# Patient Record
Sex: Female | Born: 2000 | Race: Black or African American | Hispanic: No | Marital: Single | Smoking: Never smoker
Health system: Southern US, Community
[De-identification: ages and names within clinical notes are randomized; demographics above are authoritative.]

## PROBLEM LIST (undated history)

## (undated) DIAGNOSIS — J45909 Unspecified asthma, uncomplicated: Secondary | ICD-10-CM

## (undated) DIAGNOSIS — I10 Essential (primary) hypertension: Secondary | ICD-10-CM

## (undated) HISTORY — DX: Unspecified asthma, uncomplicated: J45.909

## (undated) HISTORY — DX: Essential (primary) hypertension: I10

## (undated) HISTORY — PX: WRIST SURGERY: SHX841

---
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
## 2021-03-27 DIAGNOSIS — M67431 Ganglion, right wrist: Secondary | ICD-10-CM | POA: Diagnosis not present

## 2021-04-24 ENCOUNTER — Encounter: Payer: Self-pay | Admitting: Internal Medicine

## 2021-04-24 ENCOUNTER — Ambulatory Visit (INDEPENDENT_AMBULATORY_CARE_PROVIDER_SITE_OTHER): Payer: BC Managed Care – PPO | Admitting: Internal Medicine

## 2021-04-24 VITALS — BP 125/69 | HR 65 | Ht 65.0 in | Wt 202.1 lb

## 2021-04-24 DIAGNOSIS — J45909 Unspecified asthma, uncomplicated: Secondary | ICD-10-CM | POA: Diagnosis not present

## 2021-04-24 DIAGNOSIS — Z0000 Encounter for general adult medical examination without abnormal findings: Secondary | ICD-10-CM

## 2021-04-24 DIAGNOSIS — Z23 Encounter for immunization: Secondary | ICD-10-CM | POA: Diagnosis not present

## 2021-04-24 DIAGNOSIS — Z789 Other specified health status: Secondary | ICD-10-CM

## 2021-04-24 NOTE — Progress Notes (Signed)
   CC: establish care and preventive care  HPI:  Ms.Ladavia A Brooker is a 20 y.o. with medical history of asthma (no prior PFTs) presenting to [facility] to establish care.  She states she moved from [place] with her mother.  She is currently enrolled at [employer] and is pursuing a degree in nursing.  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   Please see problem-based list for further details, assessments, and plans  Review of Systems:   As noted in problem-based list  Surgical History: Wisdom teeth extraction in 2017  Family History  Problem Relation Age of Onset   Diabetes Mother    Hypertension Mother    Allergies Mother    Stroke Father    Cancer Maternal Grandmother    Diabetes Maternal Grandfather      Physical Exam:  Vitals:   04/24/21 1514  BP: 125/69  Pulse: 65  SpO2: 99%  Weight: 202 lb 1.6 oz (91.7 kg)  Height: 5\' 5"  (1.651 m)    General: Well nourished, well developed HENT: NCAT Eyes: No scleral icterus, conjunctiva clear CV:no murmurs, Normal rate and rhythm Pulm: Clear to auscultation bilaterally, pulmonary effort GI: Nondistended, bowel sounds present, soft MSK: Normal bulk and tone, no edema in lower extremities bilaterally Skin: warm and dry Psych: normal mood and affect  Assessment & Plan:   See Encounters Tab for problem based charting.  Patient seen with Dr. 

## 2021-04-24 NOTE — Assessment & Plan Note (Addendum)
Received tetanus vaccine today (Td).  Patient is interested in HPV vaccination.  Asked her to call to schedule an appointment at [facility] Department to receive this.  She states that she has not received a flu vaccine in the past because her mom did not have her get it.  We discussed the benefits of flu vaccination and Kayla Perez stated she would think it over.  History of Nexplanon placement in 01/2019, Will need referral to OBGYN to have this replaced in 01/2022.  Asked patient to follow-up in 6 months.  Discussed recommendation of screening for HIV, Hep C, and Gonarrhea/ Chlamydia.  At this time, she is not interested in screenings.

## 2021-04-24 NOTE — Assessment & Plan Note (Signed)
Patient presents with history of asthma diagnosed in childhood.  She states that she has never had formal testing for this.  She does have a rescue albuterol inhaler if she were to need it. The only time she uses the inhaler and is if she has bronchitis, which has not happened in the long time.

## 2021-04-24 NOTE — Patient Instructions (Signed)
[name], it was a pleasure seeing you today!  Today we discussed: Preventative medicine- today you received a tetanus vaccination.  You will need to get this every 10 years to help prevent tetanus.  We also talked about getting a vaccine called Gardisil to prevent cancer from Human papilloma virus.  Please call the [facility] department to see if they are able to give you that vaccine at [phone].  We also discussed other screening recommended for your age including gonarrhea/ chalmydia, Hep C, and HIV.  You did not want those at this time.  I have ordered the following labs today:  Lab Orders  No laboratory test(s) ordered today     Tests ordered today:  None  Referrals ordered today:   Referral Orders  No referral(s) requested today     I have ordered the following medication/changed the following medications:   Stop the following medications: Medications Discontinued During This Encounter  Medication Reason   fluticasone (FLONASE) 50 MCG/ACT nasal spray Change in therapy   loratadine (CLARITIN) 10 MG tablet Change in therapy   fluticasone (FLONASE) 50 MCG/ACT nasal spray Change in therapy   ondansetron (ZOFRAN ODT) 4 MG disintegrating tablet Change in therapy   desloratadine (CLARINEX) 5 MG tablet Change in therapy     Start the following medications: No orders of the defined types were placed in this encounter.    Follow-up: 6 months   Please make sure to arrive 15 minutes prior to your next appointment. If you arrive late, you may be asked to reschedule.   We look forward to seeing you next time. Please call our clinic at [phone] if you have any questions or concerns. The best time to call is Monday-Friday from 9am-4pm, but there is someone available 24/7. If after hours or the weekend, call the main hospital number and ask for the Internal Medicine Resident On-Call. If you need medication refills, please notify your pharmacy one week in  advance and they will send [place] a request.  Thank you for letting us take part in your care. Wishing you the best!  Thank you, Dr. [facility]

## 2021-04-25 NOTE — Progress Notes (Signed)
Internal Medicine Clinic Attending  I saw and evaluated the patient.  I personally confirmed the key portions of the history and exam documented by Dr. Masters and I reviewed pertinent patient test results.  The assessment, diagnosis, and plan were formulated together and I agree with the documentation in the resident's note.  

## 2021-08-10 DIAGNOSIS — M7671 Peroneal tendinitis, right leg: Secondary | ICD-10-CM | POA: Diagnosis not present

## 2021-11-02 DIAGNOSIS — M67431 Ganglion, right wrist: Secondary | ICD-10-CM | POA: Diagnosis not present

## 2021-11-08 ENCOUNTER — Ambulatory Visit: Payer: Self-pay

## 2021-11-08 NOTE — Telephone Encounter (Signed)
Pt mother stated pt had a procedure on the wrist on Friday, Nov 02, 2021. After the procedure, pt stated a possible infection in her nose; the nose is bleeding, crusty, itchy, and tender. Pt mother mentioned pt went to urgent care and got medication, but it's not getting any better.     Chief Complaint: Sore inside left nostril, seen in UC and started on Amoxicillin and steroid nasal spray. Symptoms: Has "crusty discharge." Frequency: 3 days ago Pertinent Negatives: Patient denies fever Disposition: [] ED /[] Urgent Care (no appt availability in office) / [] Appointment(In office/virtual)/ []  [facility]/ [x] Home Care/ [] Refused Recommended Disposition /[] [place] [facility]/ []  Follow-up with PCP Additional Notes: Instructed to complete medications and watch for signs of infection and go back to UC. No PCP.  Answer Assessment - Initial Assessment Questions 1. APPEARANCE of SORES: "What do the sores look like?"     White thick pus 2. NUMBER: "How many sores are there?"     1 3. SIZE: "How big is the largest sore?"     Unsure 4. LOCATION: "Where are the sores located?"     Right nostril 5. ONSET: "When did the sores begin?"     Last week 6. CAUSE: "What do you think is causing the sores?"     Infection 7. OTHER SYMPTOMS: "Do you have any other symptoms?" (e.g., fever, new weakness)     Painful, crusty  Protocols used: Sores-A-AH

## 2021-11-14 ENCOUNTER — Ambulatory Visit (HOSPITAL_COMMUNITY)
Admission: EM | Admit: 2021-11-14 | Discharge: 2021-11-14 | Disposition: A | Discharge status: Home / Self Care | Payer: BC Managed Care – PPO | Attending: Physician Assistant | Admitting: Physician Assistant

## 2021-11-14 ENCOUNTER — Encounter (HOSPITAL_COMMUNITY): Payer: Self-pay

## 2021-11-14 DIAGNOSIS — J309 Allergic rhinitis, unspecified: Secondary | ICD-10-CM | POA: Diagnosis not present

## 2021-11-14 DIAGNOSIS — R0981 Nasal congestion: Secondary | ICD-10-CM

## 2021-11-14 DIAGNOSIS — J029 Acute pharyngitis, unspecified: Secondary | ICD-10-CM

## 2021-11-14 LAB — POCT RAPID STREP A, ED / UC: Streptococcus, Group A Screen (Direct): NEGATIVE

## 2021-11-14 LAB — POCT INFECTIOUS MONO SCREEN, ED / UC: Mono Screen: NEGATIVE

## 2021-11-14 MED ORDER — LEVOCETIRIZINE DIHYDROCHLORIDE 5 MG PO TABS: 5.0000 mg | ORAL_TABLET | Freq: Every evening | ORAL | 0 refills | Status: AC

## 2021-11-14 MED ORDER — PREDNISONE 20 MG PO TABS: 20.0000 mg | ORAL_TABLET | Freq: Every day | ORAL | 0 refills | Status: AC

## 2021-11-14 MED ORDER — FLUTICASONE PROPIONATE 50 MCG/ACT NA SUSP: 1.0000 | Freq: Every day | NASAL | 0 refills | Status: DC

## 2021-11-14 NOTE — ED Triage Notes (Signed)
Pt c/o nasal congestion for several days. She thinks she has nasal polyp.Wrist surgery x 1 week ago.

## 2021-11-14 NOTE — ED Provider Notes (Signed)
[facility]    CSN: [id number] Arrival date & time: 11/14/21  1612      History   Chief Complaint Chief Complaint  Patient presents with   Nasal Polyps    HPI Kayla Perez is a 21 y.o. female.   Patient presents today with a weeklong history of nasal congestion and right-sided sinus pressure.  She had a cyst removed in her right wrist on 11/02/2021 and developed nasal congestion several days later.  She was seen by different urgent care on 11/04/2021 at which point they thought symptoms related to nasal polyps and she was started on Augmentin.  She reports taking medication as prescribed with last dose today.  Despite medication she continues to have right-sided sinus pressure as well as drainage in her throat.  She denies formal diagnosis of allergies but does have a history of asthma.  She denies any recent steroid use or history of diabetes.  She is confident that she is not pregnant.  She denies any known sick contacts.  She has not tried any over-the-counter medication for symptom management.   Past Medical History:  Diagnosis Date   Asthma     Patient Active Problem List   Diagnosis Date Noted   Asthma 04/24/2021   Health care maintenance 04/24/2021    Past Surgical History:  Procedure Laterality Date   WISDOM TOOTH EXTRACTION Bilateral 2017    OB History   No obstetric history on file.      Home Medications    Prior to Admission medications   Medication Sig Start Date End Date Taking? Authorizing Provider  fluticasone (FLONASE) 50 MCG/ACT nasal spray Place 1 spray into both nostrils daily. 11/14/21  Yes Sherian Valenza, Noberto Retort, PA-C  levocetirizine (XYZAL ALLERGY 24HR) 5 MG tablet Take 1 tablet (5 mg total) by mouth every evening. 11/14/21  Yes Estill Llerena K, PA-C  predniSONE (DELTASONE) 20 MG tablet Take 1 tablet (20 mg total) by mouth daily for 3 days. 11/14/21 11/17/21 Yes Krimson Massmann K, PA-C  albuterol (PROVENTIL HFA;VENTOLIN HFA) 108 (90 Base) MCG/ACT  inhaler Inhale into the lungs.    [provider]    Family History Family History  Problem Relation Age of Onset   Diabetes Mother    Hypertension Mother    Allergies Mother    Stroke Father    Cancer Maternal Grandmother    Diabetes Maternal Grandfather     Social History Social History   Tobacco Use   Smoking status: Never   Smokeless tobacco: Never  Vaping Use   Vaping Use: Never used  Substance Use Topics   Alcohol use: No   Drug use: No     Allergies   Patient has no known allergies.   Review of Systems Review of Systems  Constitutional:  Positive for activity change. Negative for appetite change, fatigue and fever.  HENT:  Positive for congestion and sore throat. Negative for sinus pressure and sneezing.   Respiratory:  Negative for cough and shortness of breath.   Cardiovascular:  Negative for chest pain.  Gastrointestinal:  Negative for abdominal pain, diarrhea, nausea and vomiting.  Neurological:  Negative for dizziness, light-headedness and headaches.    Physical Exam Triage Vital Signs ED Triage Vitals [11/14/21 1750]  Enc Vitals Group     BP (!) 146/91     Pulse Rate 75     Resp 16     Temp 98.1 F (36.7 C)     Temp Source Oral  SpO2 100 %     Weight      Height      Head Circumference      Peak Flow      Pain Score      Pain Loc      Pain Edu?      Excl. in GC?    No data found.  Updated Vital Signs BP (!) 146/91 (BP Location: Right Arm)   Pulse 75   Temp 98.1 F (36.7 C) (Oral)   Resp 16   SpO2 100%   Visual Acuity Right Eye Distance:   Left Eye Distance:   Bilateral Distance:    Right Eye Near:   Left Eye Near:    Bilateral Near:     Physical Exam Vitals reviewed.  Constitutional:      General: She is awake. She is not in acute distress.    Appearance: Normal appearance. She is well-developed. She is not ill-appearing.     Comments: Appears stated age in no acute distress sitting comfortably in exam room   HENT:     Head: Normocephalic and atraumatic.     Right Ear: Tympanic membrane, ear canal and external ear normal. Tympanic membrane is not erythematous or bulging.     Left Ear: Tympanic membrane, ear canal and external ear normal. Tympanic membrane is not erythematous or bulging.     Nose: Congestion present.     Right Nostril: No epistaxis or septal hematoma.     Left Nostril: No septal hematoma.     Right Turbinates: Enlarged.     Left Turbinates: Enlarged.     Right Sinus: Maxillary sinus tenderness present. No frontal sinus tenderness.     Left Sinus: No maxillary sinus tenderness or frontal sinus tenderness.     Mouth/Throat:     Pharynx: Uvula midline. Posterior oropharyngeal erythema present. No oropharyngeal exudate.     Tonsils: No tonsillar exudate. 1+ on the right. 1+ on the left.  Cardiovascular:     Rate and Rhythm: Normal rate and regular rhythm.     Heart sounds: Normal heart sounds, S1 normal and S2 normal. No murmur heard. Pulmonary:     Effort: Pulmonary effort is normal.     Breath sounds: Normal breath sounds. No wheezing, rhonchi or rales.     Comments: Clear to auscultation bilaterally Psychiatric:        Behavior: Behavior is cooperative.     UC Treatments / Results  Labs (all labs ordered are listed, but only abnormal results are displayed) Labs Reviewed  POCT RAPID STREP A, ED / UC  POCT INFECTIOUS MONO SCREEN, ED / UC    EKG   Radiology No results found.  Procedures Procedures (including critical care time)  Medications Ordered in UC Medications - No data to display  Initial Impression / Assessment and Plan / UC Course  I have reviewed the triage vital signs and the nursing notes.  Pertinent labs & imaging results that were available during my care of the patient were reviewed by me and considered in my medical decision making (see chart for details).     Discussed that symptoms seem most likely to be related to allergies given  clinical presentation.  She has already completed course of appropriate antibiotics.  She is very concerned about strep throat or some other throat infection given symptoms.  Strep testing was obtained per her request and was negative.  Mono testing was also negative.  Recommended she start Xyzal daily.  Also recommended she use nasal saline as well as Flonase for symptom relief.  We will start short course of prednisone of 20 mg for 3 days to help with symptoms and she was instructed to avoid NSAIDs with this medication due to risk of GI bleeding.  If symptoms persist she is to follow-up with ENT for further evaluation and management was given contact information for local provider with instruction to call to schedule an appointment.  Discussed that if she has any worsening symptoms she is to return for reevaluation.  Strict return precautions given to which she expressed understanding.  Work excuse note provided.  Final Clinical Impressions(s) / UC Diagnoses   Final diagnoses:  Allergic rhinitis, unspecified seasonality, unspecified trigger  Nasal congestion     Discharge Instructions      Your strep and mono were negative.  I believe that your symptoms are related to inflammation and swelling of your sinuses from allergies.  Start prednisone 20 mg for 3 days.  Do not take NSAIDs including aspirin, ibuprofen/Advil, naproxen/Aleve with this medication as it can cause stomach bleeding.  Take Xyzal daily.  Start Flonase daily.  Use nasal saline for additional symptom relief.  If your symptoms are not improving please follow-up with ENT as we discussed.  Call to schedule an appointment.  If anything worsens return for reevaluation.     ED Prescriptions     Medication Sig Dispense Auth. Provider   levocetirizine (XYZAL ALLERGY 24HR) 5 MG tablet Take 1 tablet (5 mg total) by mouth every evening. 30 tablet Naidelin Gugliotta K, PA-C   fluticasone (FLONASE) 50 MCG/ACT nasal spray Place 1 spray into both  nostrils daily. 16 g Fredrika Canby K, PA-C   predniSONE (DELTASONE) 20 MG tablet Take 1 tablet (20 mg total) by mouth daily for 3 days. 3 tablet Shaunita Seney, Noberto Retort, PA-C      PDMP not reviewed this encounter.   Jeani Hawking, PA-C 11/14/21 1856

## 2021-11-14 NOTE — Discharge Instructions (Signed)
Your strep and mono were negative.  I believe that your symptoms are related to inflammation and swelling of your sinuses from allergies.  Start prednisone 20 mg for 3 days.  Do not take NSAIDs including aspirin, ibuprofen/Advil, naproxen/Aleve with this medication as it can cause stomach bleeding.  Take Xyzal daily.  Start Flonase daily.  Use nasal saline for additional symptom relief.  If your symptoms are not improving please follow-up with ENT as we discussed.  Call to schedule an appointment.  If anything worsens return for reevaluation.

## 2021-12-03 DIAGNOSIS — Z136 Encounter for screening for cardiovascular disorders: Secondary | ICD-10-CM | POA: Diagnosis not present

## 2021-12-03 DIAGNOSIS — E669 Obesity, unspecified: Secondary | ICD-10-CM | POA: Diagnosis not present

## 2021-12-03 DIAGNOSIS — Z793 Long term (current) use of hormonal contraceptives: Secondary | ICD-10-CM | POA: Diagnosis not present

## 2021-12-03 DIAGNOSIS — L739 Follicular disorder, unspecified: Secondary | ICD-10-CM | POA: Diagnosis not present

## 2021-12-11 DIAGNOSIS — E669 Obesity, unspecified: Secondary | ICD-10-CM | POA: Diagnosis not present

## 2021-12-11 DIAGNOSIS — Z6836 Body mass index (BMI) 36.0-36.9, adult: Secondary | ICD-10-CM | POA: Diagnosis not present

## 2021-12-11 DIAGNOSIS — E7801 Familial hypercholesterolemia: Secondary | ICD-10-CM | POA: Diagnosis not present

## 2021-12-11 DIAGNOSIS — J302 Other seasonal allergic rhinitis: Secondary | ICD-10-CM | POA: Diagnosis not present

## 2022-01-20 IMAGING — US US ABDOMEN LIMITED
1 series · 15 of 25 positions shown · non-contrast
Comparison: None.

CLINICAL DATA: Right upper quadrant abdominal pain

EXAM:
ULTRASOUND ABDOMEN LIMITED RIGHT UPPER QUADRANT

[Series 1: us abdomen limited ruq mc & wl · 15 of 40 slices shown]
[im 1/40]
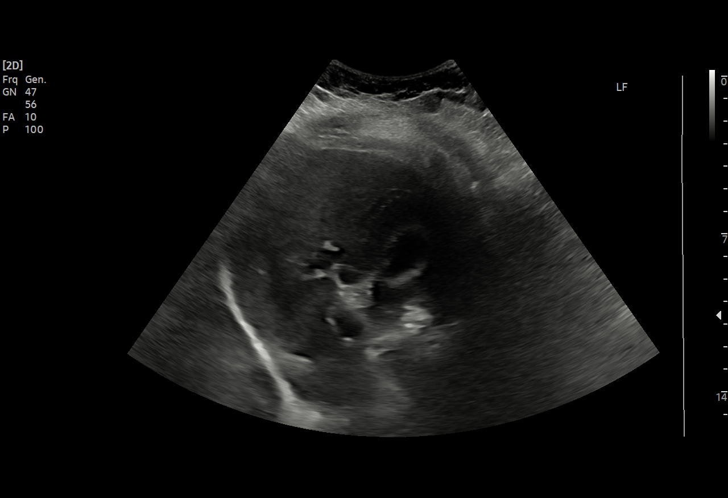
[im 4/40]
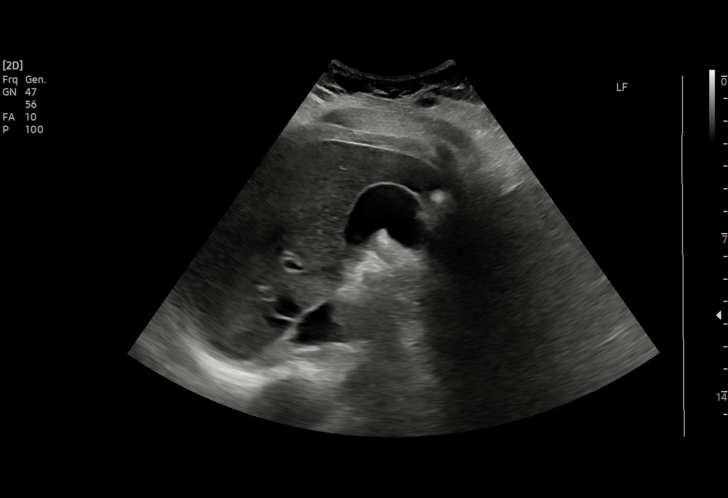
[im 7/40]
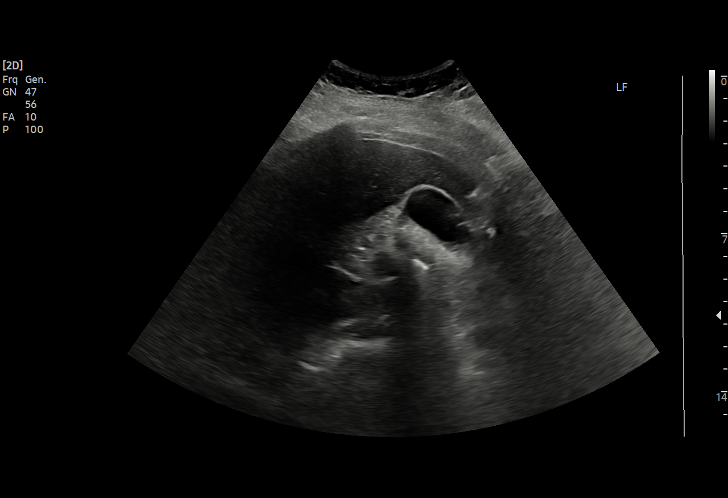
[im 9/40]
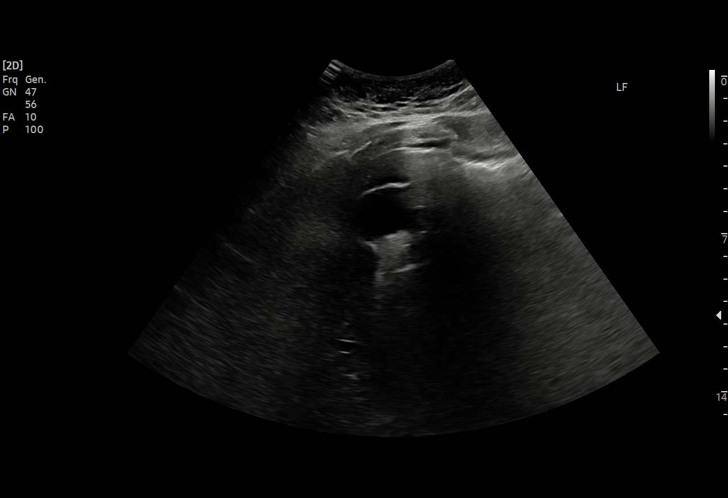
[im 12/40]
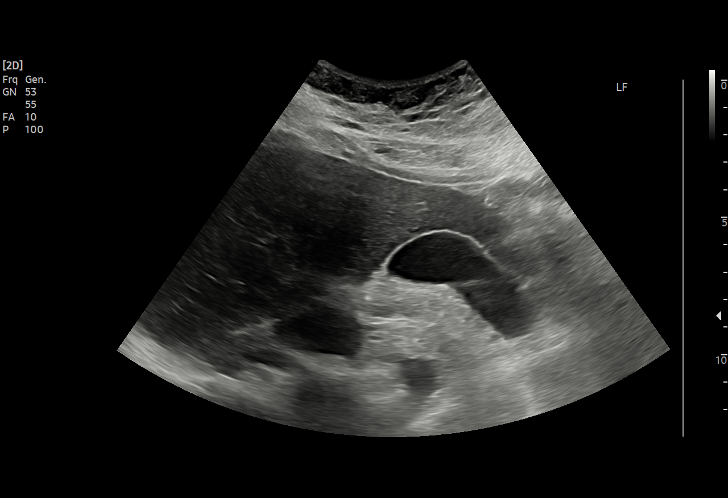
[im 15/40]
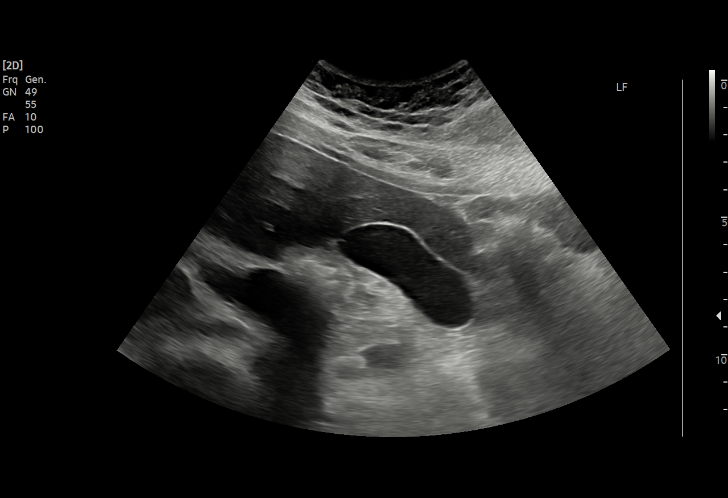
[im 17/40]
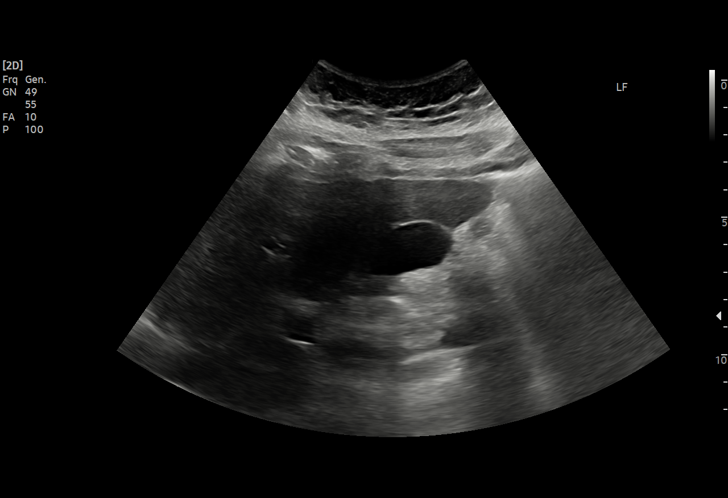
[im 20/40]
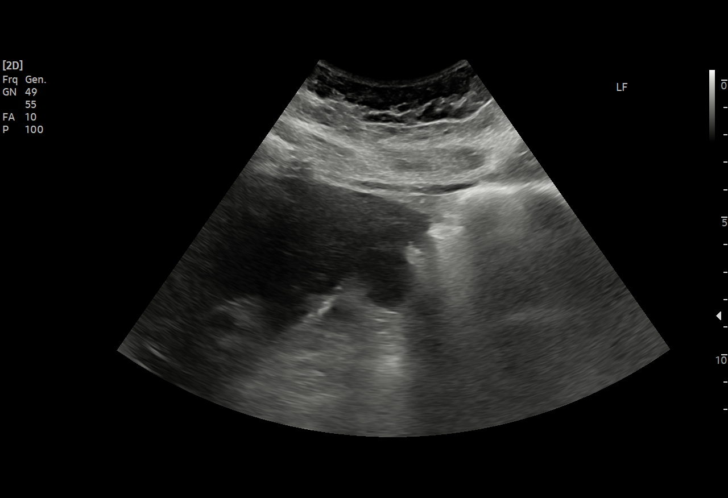
[im 23/40]
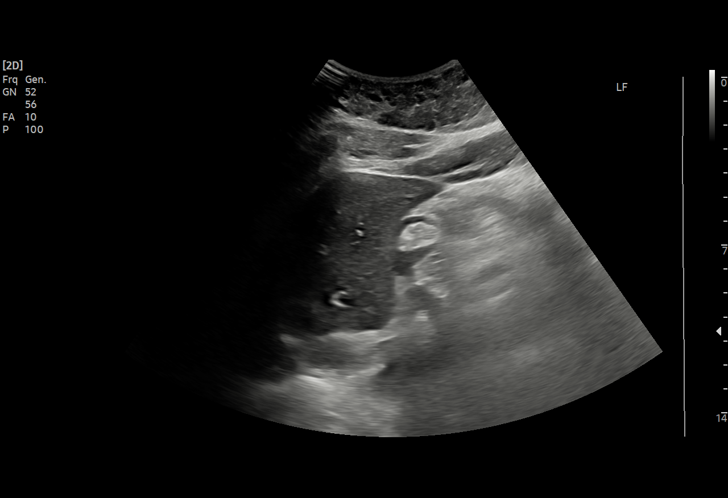
[im 25/40]
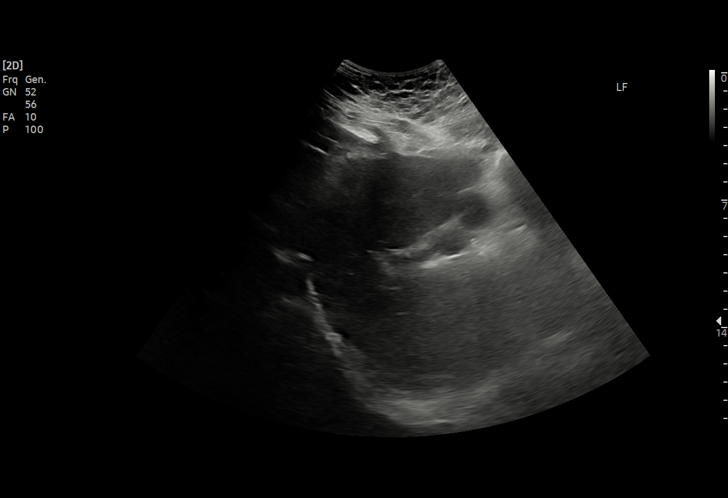
[im 28/40]
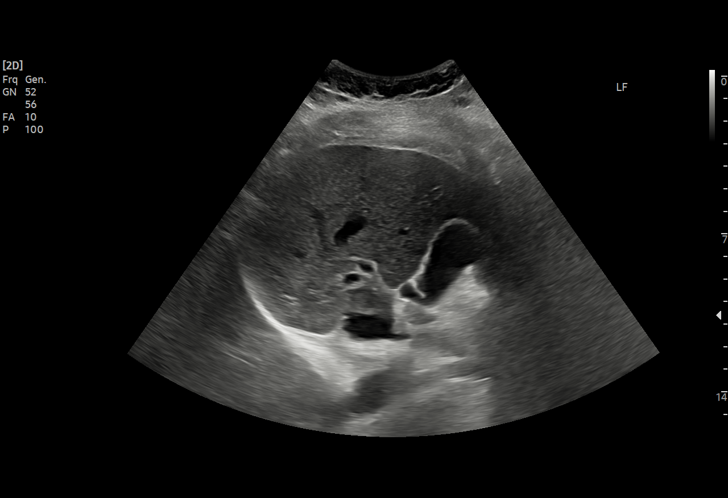
[im 31/40]
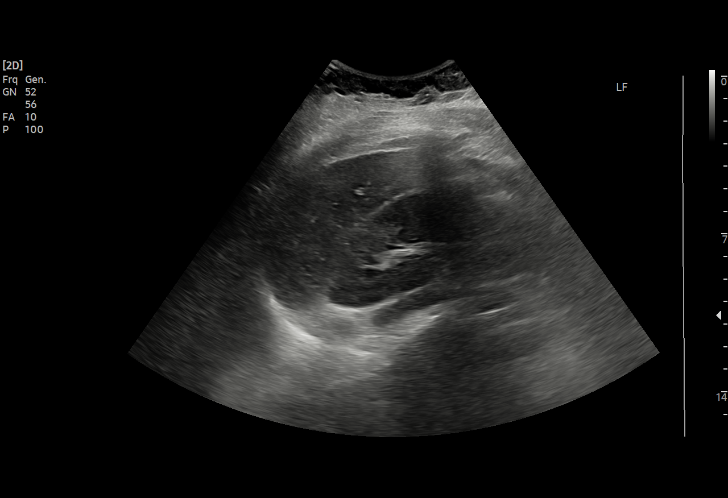
[im 33/40]
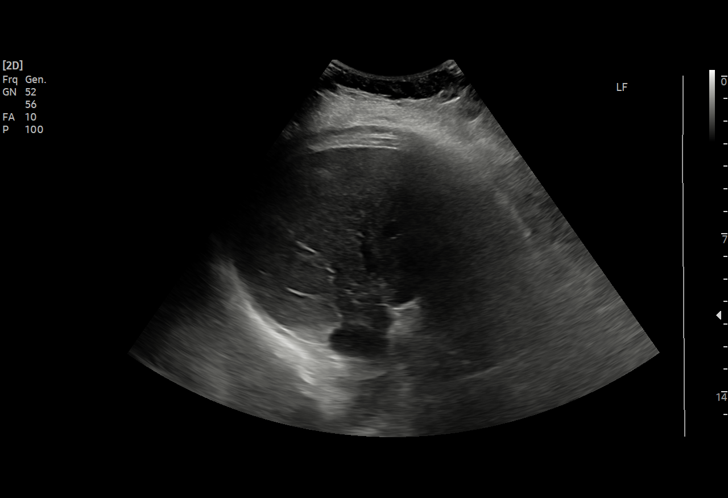
[im 36/40]
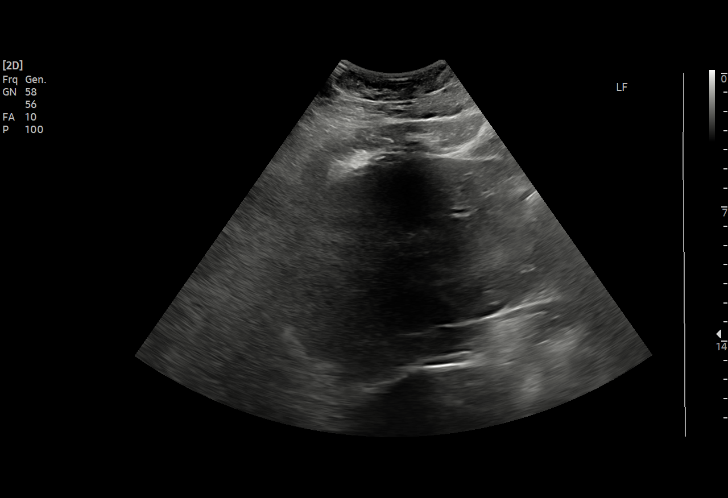
[im 40/40]
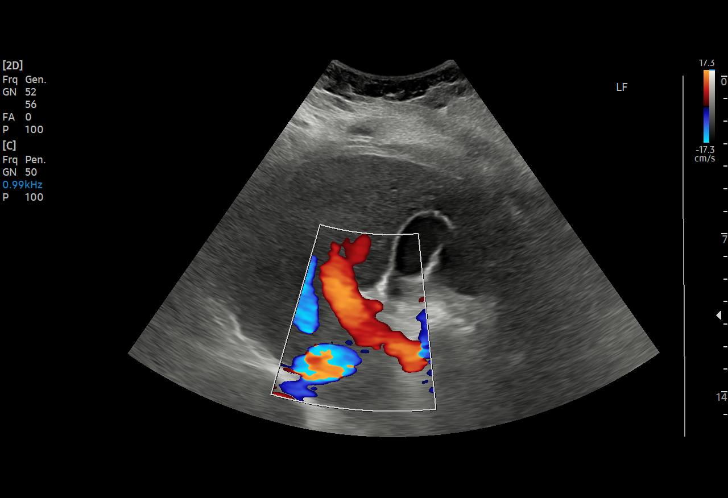

[15 of 25 positions shown; findings below may reference images not displayed]

FINDINGS: Gallbladder:

No gallstones or wall thickening visualized. No sonographic Murphy
sign noted by sonographer.

Common bile duct:

Diameter: 6 mm.  Normal.

Liver:

No focal lesion identified. Within normal limits in parenchymal
echogenicity. Portal vein is patent on color Doppler imaging with
normal direction of blood flow towards the liver.

Other: None.
IMPRESSION: Normal right upper quadrant ultrasound.

## 2022-02-11 ENCOUNTER — Other Ambulatory Visit (HOSPITAL_COMMUNITY): Payer: Self-pay

## 2022-02-11 DIAGNOSIS — Z3202 Encounter for pregnancy test, result negative: Secondary | ICD-10-CM | POA: Diagnosis not present

## 2022-02-11 DIAGNOSIS — Z01419 Encounter for gynecological examination (general) (routine) without abnormal findings: Secondary | ICD-10-CM | POA: Diagnosis not present

## 2022-02-11 DIAGNOSIS — Z793 Long term (current) use of hormonal contraceptives: Secondary | ICD-10-CM | POA: Diagnosis not present

## 2022-02-11 DIAGNOSIS — E669 Obesity, unspecified: Secondary | ICD-10-CM | POA: Diagnosis not present

## 2022-02-11 MED ORDER — SEMAGLUTIDE-WEIGHT MANAGEMENT 0.25 MG/0.5ML ~~LOC~~ SOAJ
SUBCUTANEOUS | 0 refills | Status: DC
  Filled 2022-02-11 – 2022-02-14 (×2): qty 2, 28d supply, fill #0

## 2022-02-14 ENCOUNTER — Other Ambulatory Visit (HOSPITAL_COMMUNITY): Payer: Self-pay

## 2022-03-05 ENCOUNTER — Ambulatory Visit: Payer: BC Managed Care – PPO | Admitting: Family Medicine

## 2022-03-07 ENCOUNTER — Other Ambulatory Visit (HOSPITAL_COMMUNITY): Payer: Self-pay

## 2022-05-21 ENCOUNTER — Encounter (HOSPITAL_BASED_OUTPATIENT_CLINIC_OR_DEPARTMENT_OTHER): Payer: Self-pay | Admitting: Advanced Practice Midwife

## 2022-05-21 ENCOUNTER — Ambulatory Visit (INDEPENDENT_AMBULATORY_CARE_PROVIDER_SITE_OTHER): Payer: BC Managed Care – PPO | Admitting: Advanced Practice Midwife

## 2022-05-21 VITALS — BP 140/88 | HR 86 | Ht 65.0 in | Wt 197.4 lb

## 2022-05-21 DIAGNOSIS — Z3046 Encounter for surveillance of implantable subdermal contraceptive: Secondary | ICD-10-CM

## 2022-05-21 DIAGNOSIS — Z3041 Encounter for surveillance of contraceptive pills: Secondary | ICD-10-CM | POA: Insufficient documentation

## 2022-05-21 NOTE — Progress Notes (Signed)
GYNECOLOGY CLINIC PROCEDURE NOTE  Kayla Perez is a 21 y.o. G0P0000 here for Nexplanon removal. Nexplanon was placed in 2020 and was due for removal this year. She desires to switch to OCPs and have monthly menses at this time.  PCP prescribed low dose OCPs which she has taken x 2 months without difficulty.     Nexplanon Removal Patient identified, informed consent performed, consent signed.   Appropriate time out taken. Nexplanon site identified.  Area prepped in usual sterile fashon. One ml of 1% lidocaine was used to anesthetize the area at the distal end of the implant. A small stab incision was made right beside the implant on the distal portion.  The Nexplanon rod was grasped using hemostats and removed without difficulty.  There was minimal blood loss. There were no complications.  3 ml of 1% lidocaine was injected around the incision for post-procedure analgesia.  Steri-strips were applied over the small incision.  A pressure bandage was applied to reduce any bruising.  The patient tolerated the procedure well and was given post procedure instructions.  Patient is planning to use OCPs for contraception/attempt conception.  F/U in 1 year for Pap or sooner as needed. BP mildly elevated today, discussed with patient who will take BP on home cuff and f/u with PCP if persistently elevated.  Reviewed warning signs with OCPs/reasons to seek care.  No follow-ups on file.   Sharen Counter, CNM 8:58 AM

## 2022-09-06 DIAGNOSIS — J302 Other seasonal allergic rhinitis: Secondary | ICD-10-CM | POA: Diagnosis not present

## 2022-09-06 DIAGNOSIS — R059 Cough, unspecified: Secondary | ICD-10-CM | POA: Diagnosis not present

## 2022-09-06 DIAGNOSIS — J019 Acute sinusitis, unspecified: Secondary | ICD-10-CM | POA: Diagnosis not present

## 2022-10-21 DIAGNOSIS — N921 Excessive and frequent menstruation with irregular cycle: Secondary | ICD-10-CM | POA: Diagnosis not present

## 2022-10-21 DIAGNOSIS — N939 Abnormal uterine and vaginal bleeding, unspecified: Secondary | ICD-10-CM | POA: Diagnosis not present

## 2023-01-31 ENCOUNTER — Emergency Department (HOSPITAL_COMMUNITY)
Admission: EM | Admit: 2023-01-31 | Discharge: 2023-01-31 | Disposition: A | Discharge status: Home / Self Care | Payer: BC Managed Care – PPO | Attending: Emergency Medicine | Admitting: Emergency Medicine

## 2023-01-31 DIAGNOSIS — R197 Diarrhea, unspecified: Secondary | ICD-10-CM | POA: Diagnosis not present

## 2023-01-31 DIAGNOSIS — R000 Tachycardia, unspecified: Secondary | ICD-10-CM | POA: Diagnosis not present

## 2023-01-31 DIAGNOSIS — R1110 Vomiting, unspecified: Secondary | ICD-10-CM | POA: Diagnosis present

## 2023-01-31 DIAGNOSIS — R112 Nausea with vomiting, unspecified: Secondary | ICD-10-CM | POA: Diagnosis not present

## 2023-01-31 DIAGNOSIS — T383X1A Poisoning by insulin and oral hypoglycemic [antidiabetic] drugs, accidental (unintentional), initial encounter: Secondary | ICD-10-CM | POA: Diagnosis not present

## 2023-01-31 DIAGNOSIS — T50905A Adverse effect of unspecified drugs, medicaments and biological substances, initial encounter: Secondary | ICD-10-CM

## 2023-01-31 LAB — URINALYSIS, ROUTINE W REFLEX MICROSCOPIC
Bacteria, UA: NONE SEEN
Bilirubin Urine: NEGATIVE
Glucose, UA: NEGATIVE mg/dL
Ketones, ur: 80 mg/dL — AB
Leukocytes,Ua: NEGATIVE
Nitrite: NEGATIVE
Protein, ur: 100 mg/dL — AB
Specific Gravity, Urine: 1.031 — ABNORMAL HIGH (ref 1.005–1.030)
pH: 8 (ref 5.0–8.0)

## 2023-01-31 LAB — CBC
HCT: 44.9 % (ref 36.0–46.0)
Hemoglobin: 14.6 g/dL (ref 12.0–15.0)
MCH: 27.1 pg (ref 26.0–34.0)
MCHC: 32.5 g/dL (ref 30.0–36.0)
MCV: 83.5 fL (ref 80.0–100.0)
Platelets: 273 10*3/uL (ref 150–400)
RBC: 5.38 MIL/uL — ABNORMAL HIGH (ref 3.87–5.11)
RDW: 14.1 % (ref 11.5–15.5)
WBC: 16.1 10*3/uL — ABNORMAL HIGH (ref 4.0–10.5)
nRBC: 0 % (ref 0.0–0.2)

## 2023-01-31 LAB — COMPREHENSIVE METABOLIC PANEL
ALT: 22 U/L (ref 0–44)
AST: 21 U/L (ref 15–41)
Albumin: 4.4 g/dL (ref 3.5–5.0)
Alkaline Phosphatase: 85 U/L (ref 38–126)
Anion gap: 10 (ref 5–15)
BUN: 12 mg/dL (ref 6–20)
CO2: 23 mmol/L (ref 22–32)
Calcium: 9.7 mg/dL (ref 8.9–10.3)
Chloride: 104 mmol/L (ref 98–111)
Creatinine, Ser: 0.48 mg/dL (ref 0.44–1.00)
GFR, Estimated: >60 mL/min (ref >60)
Glucose, Bld: 105 mg/dL — ABNORMAL HIGH (ref 70–99)
Potassium: 3.8 mmol/L (ref 3.5–5.1)
Sodium: 137 mmol/L (ref 135–145)
Total Bilirubin: 0.9 mg/dL (ref 0.3–1.2)
Total Protein: 8.4 g/dL — ABNORMAL HIGH (ref 6.5–8.1)

## 2023-01-31 LAB — LIPASE, BLOOD: Lipase: 27 U/L (ref 11–51)

## 2023-01-31 LAB — CBG MONITORING, ED: Glucose-Capillary: 101 mg/dL — ABNORMAL HIGH (ref 70–99)

## 2023-01-31 LAB — HCG, SERUM, QUALITATIVE: Preg, Serum: NEGATIVE

## 2023-01-31 MED ORDER — METOCLOPRAMIDE HCL 5 MG/ML IJ SOLN
10.0000 mg | Freq: Once | INTRAMUSCULAR | Status: AC
  Administered 2023-01-31: 10 mg via INTRAVENOUS
  Filled 2023-01-31: qty 2

## 2023-01-31 MED ORDER — PROCHLORPERAZINE EDISYLATE 10 MG/2ML IJ SOLN
10.0000 mg | Freq: Once | INTRAMUSCULAR | Status: AC
  Administered 2023-01-31: 10 mg via INTRAVENOUS
  Filled 2023-01-31: qty 2

## 2023-01-31 MED ORDER — ONDANSETRON 8 MG PO TBDP: 8.0000 mg | ORAL_TABLET | Freq: Three times a day (TID) | ORAL | 0 refills | Status: DC | PRN

## 2023-01-31 MED ORDER — PROMETHAZINE HCL 25 MG PO TABS: 25.0000 mg | ORAL_TABLET | Freq: Four times a day (QID) | ORAL | 0 refills | Status: DC | PRN

## 2023-01-31 MED ORDER — ONDANSETRON 8 MG PO TBDP
8.0000 mg | ORAL_TABLET | Freq: Once | ORAL | Status: AC
  Administered 2023-01-31: 8 mg via ORAL
  Filled 2023-01-31: qty 1

## 2023-01-31 MED ORDER — ONDANSETRON 4 MG PO TBDP
4.0000 mg | ORAL_TABLET | Freq: Once | ORAL | Status: AC | PRN
  Administered 2023-01-31: 4 mg via ORAL
  Filled 2023-01-31: qty 1

## 2023-01-31 MED ORDER — LACTATED RINGERS IV BOLUS
1000.0000 mL | Freq: Once | INTRAVENOUS | Status: AC
  Administered 2023-01-31: 1000 mL via INTRAVENOUS

## 2023-01-31 MED ORDER — PROMETHAZINE HCL 25 MG RE SUPP: 25.0000 mg | Freq: Four times a day (QID) | RECTAL | 0 refills | Status: DC | PRN

## 2023-01-31 MED ORDER — ACETAMINOPHEN 325 MG PO TABS
650.0000 mg | ORAL_TABLET | Freq: Once | ORAL | Status: AC
  Administered 2023-01-31: 650 mg via ORAL
  Filled 2023-01-31: qty 2

## 2023-01-31 MED ORDER — LACTATED RINGERS IV SOLN: INTRAVENOUS | Status: DC

## 2023-01-31 NOTE — ED Notes (Signed)
Pt reporting nausea returning as well as burning stomach pain.

## 2023-01-31 NOTE — Discharge Instructions (Addendum)
Your prescription for nausea medicine has been sent to [facility] pharmacy.  As recommended, please proceed with clear liquid diet for the next 2 days.  Slowly advance to more solid diet.  Avoid processed foods, fried food and dairy products until completely better.

## 2023-01-31 NOTE — ED Provider Notes (Signed)
[place] EMERGENCY DEPARTMENT AT [facility] Provider Note   CSN: [id number] Arrival date & time: 01/31/23  0047     History  Chief Complaint  Patient presents with   Emesis    Kayla Perez is a 22 y.o. female.  The history is provided by the patient and medical records.  Emesis  Kayla Perez is a 22 y.o. female who presents to the Emergency Department complaining of vomiting.  She presents to the emergency department for severe vomiting that started at 2 PM.  At 10 AM this morning she took a 1 mg dose of her mother's Ozempic.  She does report episodes of getting hot as well as a small amount of diarrhea but no reported fevers.  She has abdominal discomfort but not discrete pain.  She did take a similar medication 1 year ago but she did not tolerate it due to nausea.  LMP was July 19.  No dysuria.  No known medical problems.     Home Medications Prior to Admission medications   Medication Sig Start Date End Date Taking? Authorizing Provider  albuterol (PROVENTIL HFA;VENTOLIN HFA) 108 (90 Base) MCG/ACT inhaler Inhale into the lungs. Patient not taking: Reported on 05/21/2022    [provider]  BLISOVI FE 1/20 1-20 MG-MCG tablet Take 1 tablet by mouth daily. 04/05/22   [provider]  fluticasone (FLONASE) 50 MCG/ACT nasal spray Place 1 spray into both nostrils daily. Patient not taking: Reported on 05/21/2022 11/14/21   Raspet, Noberto Retort, PA-C  levocetirizine (XYZAL ALLERGY 24HR) 5 MG tablet Take 1 tablet (5 mg total) by mouth every evening. 11/14/21   Raspet, Noberto Retort, PA-C  Semaglutide-Weight Management 0.25 MG/0.5ML SOAJ Inject 0.25 mg into the skin once weekly. Patient not taking: Reported on 05/21/2022 12/26/21         Allergies    Patient has no known allergies.    Review of Systems   Review of Systems  Gastrointestinal:  Positive for vomiting.  All other systems reviewed and are negative.   Physical Exam Updated Vital Signs BP 122/72    Pulse 76   Temp 99.1 F (37.3 C)   Resp 20   Ht 5\' 5"  (1.651 m)   Wt 86.2 kg   SpO2 100%   BMI 31.62 kg/m  Physical Exam Vitals and nursing note reviewed.  Constitutional:      Appearance: She is well-developed.  HENT:     Head: Normocephalic and atraumatic.  Cardiovascular:     Rate and Rhythm: Regular rhythm. Tachycardia present.  Pulmonary:     Effort: Pulmonary effort is normal. No respiratory distress.     Breath sounds: Normal breath sounds.  Abdominal:     Palpations: Abdomen is soft.     Tenderness: There is no abdominal tenderness. There is no guarding or rebound.  Musculoskeletal:        General: No tenderness.  Skin:    General: Skin is warm and dry.  Neurological:     Mental Status: She is alert and oriented to person, place, and time.  Psychiatric:        Behavior: Behavior normal.     ED Results / Procedures / Treatments   Labs (all labs ordered are listed, but only abnormal results are displayed) Labs Reviewed  COMPREHENSIVE METABOLIC PANEL - Abnormal; Notable for the following components:      Result Value   Glucose, Bld 105 (*)    Total Protein 8.4 (*)  All other components within normal limits  CBC - Abnormal; Notable for the following components:   WBC 16.1 (*)    RBC 5.38 (*)    All other components within normal limits  URINALYSIS, ROUTINE W REFLEX MICROSCOPIC - Abnormal; Notable for the following components:   Specific Gravity, Urine 1.031 (*)    Hgb urine dipstick SMALL (*)    Ketones, ur 80 (*)    Protein, ur 100 (*)    All other components within normal limits  CBG MONITORING, ED - Abnormal; Notable for the following components:   Glucose-Capillary 101 (*)    All other components within normal limits  LIPASE, BLOOD  HCG, SERUM, QUALITATIVE    EKG None  Radiology No results found.  Procedures Procedures    Medications Ordered in ED Medications  lactated ringers infusion (has no administration in time range)   ondansetron (ZOFRAN-ODT) disintegrating tablet 4 mg (4 mg Oral Given 01/31/23 0114)  lactated ringers bolus 1,000 mL (0 mLs Intravenous Stopped 01/31/23 0434)  metoCLOPramide (REGLAN) injection 10 mg (10 mg Intravenous Given 01/31/23 0251)  metoCLOPramide (REGLAN) injection 10 mg (10 mg Intravenous Given 01/31/23 0400)  prochlorperazine (COMPAZINE) injection 10 mg (10 mg Intravenous Given 01/31/23 3086)    ED Course/ Medical Decision Making/ A&P                                 Medical Decision Making Amount and/or Complexity of Data Reviewed Labs: ordered.  Risk Prescription drug management.   Patient without significant medical history here for evaluation of intractable vomiting after taking her mother's Ozempic at 10 AM today.  Patient with numerous episodes of emesis during the ED stay.  She was treated with IV fluid hydration as well as ondansetron, metoclopramide and Compazine.  CBC with leukocytosis, likely secondary to demargination as she has no significant abdominal tenderness.  UA is not consistent with UTI.  She does have ketones present.  No prior abdominal surgeries.  Presentation is not consistent with SBO.  Plan to continue symptom management.  If patient continues to fail oral fluid hydration she may need to be admitted for ongoing IV fluids until she metabolizes the medication.  Patient care transferred pending repeat evaluation.        Final Clinical Impression(s) / ED Diagnoses Final diagnoses:  None    Rx / DC Orders ED Discharge Orders     None         Tilden Fossa, MD 01/31/23 [phone]

## 2023-01-31 NOTE — ED Provider Notes (Signed)
  Physical Exam  BP 122/72   Pulse 76   Temp 99.1 F (37.3 C)   Resp 20   Ht 5\' 5"  (1.651 m)   Wt 86.2 kg   SpO2 100%   BMI 31.62 kg/m   Physical Exam  Procedures  Procedures  ED Course / MDM    Medical Decision Making Amount and/or Complexity of Data Reviewed Labs: ordered.  Risk OTC drugs. Prescription drug management.   Patient came to the emergency room with chief complaint of nausea and vomiting.  She had taken Ozempic, high dose last night.  Symptoms started middle the night.  Patient could not tolerate p.o., and was switched to IV antiemetics.  Patient reassessed at 7:30 AM.  She was not vomiting at that time.  States that she feels better but dry.  IV fluid ordered.  Patient has already initiated p.o. challenge. We will reassess her around 9 AM.  9 AM reassessment: Patient continues to do well, no emesis still.  Patient feels slightly nauseous when awake, but able to get restful sleep now.  Heart rate has come down.  She is stable for discharge at this time.  We will ensure that the fluid that was initiated will be completed, as well as that she has a liter of fluid in her before she is discharged.       Derwood Kaplan, MD 01/31/23 [phone]

## 2023-01-31 NOTE — ED Triage Notes (Signed)
Patient coming to ED for evaluation of nausea after taking Ozempic 1 mg.  Patient states medication was given to her to try.  Took first dose this morning and began having nausea and vomiting.  States she is unable to keep "anything in my stomach."  No diarrhea

## 2023-01-31 NOTE — ED Notes (Signed)
Pt tolerating ice chips. Reports improvement in symptoms

## 2023-06-03 DIAGNOSIS — N939 Abnormal uterine and vaginal bleeding, unspecified: Secondary | ICD-10-CM | POA: Diagnosis not present

## 2023-06-03 DIAGNOSIS — E785 Hyperlipidemia, unspecified: Secondary | ICD-10-CM | POA: Diagnosis not present

## 2023-06-11 DIAGNOSIS — Z113 Encounter for screening for infections with a predominantly sexual mode of transmission: Secondary | ICD-10-CM | POA: Diagnosis not present

## 2023-06-11 DIAGNOSIS — Z0000 Encounter for general adult medical examination without abnormal findings: Secondary | ICD-10-CM | POA: Diagnosis not present

## 2023-06-11 DIAGNOSIS — Z1331 Encounter for screening for depression: Secondary | ICD-10-CM | POA: Diagnosis not present

## 2023-06-11 DIAGNOSIS — Z139 Encounter for screening, unspecified: Secondary | ICD-10-CM | POA: Diagnosis not present

## 2023-06-11 DIAGNOSIS — Z23 Encounter for immunization: Secondary | ICD-10-CM | POA: Diagnosis not present

## 2023-06-11 DIAGNOSIS — J45990 Exercise induced bronchospasm: Secondary | ICD-10-CM | POA: Diagnosis not present

## 2023-09-09 DIAGNOSIS — R7301 Impaired fasting glucose: Secondary | ICD-10-CM | POA: Diagnosis not present

## 2023-09-09 DIAGNOSIS — Z113 Encounter for screening for infections with a predominantly sexual mode of transmission: Secondary | ICD-10-CM | POA: Diagnosis not present

## 2023-09-09 DIAGNOSIS — E88819 Insulin resistance, unspecified: Secondary | ICD-10-CM | POA: Diagnosis not present

## 2023-11-05 ENCOUNTER — Ambulatory Visit (INDEPENDENT_AMBULATORY_CARE_PROVIDER_SITE_OTHER): Admitting: Nurse Practitioner

## 2023-11-05 ENCOUNTER — Other Ambulatory Visit (HOSPITAL_COMMUNITY)
Admission: RE | Admit: 2023-11-05 | Discharge: 2023-11-05 | Disposition: A | Discharge status: Home / Self Care | Source: Ambulatory Visit | Attending: Nurse Practitioner | Admitting: Nurse Practitioner

## 2023-11-05 ENCOUNTER — Encounter: Payer: Self-pay | Admitting: Nurse Practitioner

## 2023-11-05 VITALS — BP 128/82 | HR 74 | Ht 65.0 in | Wt 211.0 lb

## 2023-11-05 DIAGNOSIS — Z124 Encounter for screening for malignant neoplasm of cervix: Secondary | ICD-10-CM | POA: Insufficient documentation

## 2023-11-05 DIAGNOSIS — Z789 Other specified health status: Secondary | ICD-10-CM | POA: Diagnosis not present

## 2023-11-05 NOTE — Progress Notes (Signed)
   Acute Office Visit  Subjective:    Patient ID: Kayla Perez, female    DOB: April 12, 2001, 23 y.o.   MRN: [id number]   HPI 23 y.o. G0 presents today as new patient for fertility consult. Has been trying to conceive for 18 months. Monthly cycles. Has been tracking cycles/ovulation with app. Does not notice ovulation symptoms. Occasional alcohol use, regular marijuana use. Same partner for 4 years. He has conceived once, he is 22 yo, does smoke cigarettes. H/O HSV. Negative STD screening in March. A1c 5.3 in March. Has not had pap smear. Completed Gardasil.   Patient's last menstrual period was 10/23/2023 (exact date). Period Cycle (Days): 28 Period Duration (Days): 6 Menstrual Flow:  (Heavy w/ clots for the first few days, then lightens up.) Menstrual Control: Tampon Dysmenorrhea: (!) Moderate Dysmenorrhea Symptoms: Cramping, Nausea  Review of Systems  Constitutional: Negative.   Genitourinary: Negative.        Objective:     Physical Exam Constitutional:      Appearance: Normal appearance. She is obese.  Genitourinary:    General: Normal vulva.     Vagina: Normal.     Cervix: Normal.     Uterus: Normal.      Adnexa: Right adnexa normal and left adnexa normal.     BP 128/82   Pulse 74   Ht 5\' 5"  (1.651 m) Comment: pt reported  Wt 211 lb (95.7 kg)   LMP 10/23/2023 (Exact Date)   SpO2 97%   BMI 35.11 kg/m  Wt Readings from Last 3 Encounters:  11/05/23 211 lb (95.7 kg)  01/31/23 190 lb (86.2 kg)  05/21/22 197 lb 6.4 oz (89.5 kg)        Patient informed chaperone available to be present for breast and/or pelvic exam. Patient has requested no chaperone to be present. Patient has been advised what will be completed during breast and pelvic exam.   Assessment & Plan:   Problem List Items Addressed This Visit   None Visit Diagnoses       Attempting to conceive    -  Primary   Relevant Orders   Estradiol   Follicle stimulating hormone   Luteinizing hormone    Anti-Mullerian Hormone (AMH), Female   TSH     Cervical cancer screening       Relevant Orders   Cytology - PAP( [place])      Plan: Start PNV, continue tracking cycles, encouraged healthy living, decrease nicotine and marijuana use. Return for cycle day 2 and cycle day 21 labs. Pap pending.     Andee Bamberger DNP, 10:52 AM 11/05/2023

## 2023-11-06 LAB — CYTOLOGY - PAP: Diagnosis: NEGATIVE

## 2023-11-07 ENCOUNTER — Ambulatory Visit: Payer: Self-pay | Admitting: Nurse Practitioner

## 2023-11-25 ENCOUNTER — Other Ambulatory Visit

## 2023-11-25 DIAGNOSIS — Z789 Other specified health status: Secondary | ICD-10-CM

## 2023-11-25 LAB — FOLLICLE STIMULATING HORMONE: FSH: 4.2 m[IU]/mL

## 2023-11-25 LAB — TSH: TSH: 0.25 m[IU]/L — ABNORMAL LOW

## 2023-11-25 LAB — ESTRADIOL: Estradiol: 38 pg/mL

## 2023-11-25 LAB — LUTEINIZING HORMONE: LH: 1.4 m[IU]/mL

## 2023-11-27 ENCOUNTER — Other Ambulatory Visit: Payer: Self-pay | Admitting: Nurse Practitioner

## 2023-11-27 DIAGNOSIS — Z789 Other specified health status: Secondary | ICD-10-CM

## 2023-11-28 LAB — ANTI-MULLERIAN HORMONE (AMH), FEMALE: Anti-Mullerian Hormones(AMH), Female: 6.53 ng/mL (ref 1.02–14.63)

## 2023-12-01 ENCOUNTER — Other Ambulatory Visit: Payer: Self-pay | Admitting: Nurse Practitioner

## 2023-12-01 DIAGNOSIS — R7989 Other specified abnormal findings of blood chemistry: Secondary | ICD-10-CM

## 2023-12-15 ENCOUNTER — Other Ambulatory Visit

## 2023-12-15 DIAGNOSIS — Z789 Other specified health status: Secondary | ICD-10-CM

## 2023-12-15 DIAGNOSIS — R7989 Other specified abnormal findings of blood chemistry: Secondary | ICD-10-CM

## 2023-12-16 LAB — THYROID PANEL WITH TSH
Free Thyroxine Index: 2.5 (ref 1.4–3.8)
T4, Total: 8.5 ug/dL (ref 5.1–11.9)
TSH: 0.76 m[IU]/L
TSH: 29 m[IU]/L (ref 22–35)

## 2023-12-16 LAB — PROGESTERONE: Progesterone: 6.4 ng/mL

## 2023-12-17 ENCOUNTER — Ambulatory Visit: Payer: Self-pay | Admitting: Radiology

## 2023-12-24 ENCOUNTER — Other Ambulatory Visit: Payer: Self-pay | Admitting: Nurse Practitioner

## 2023-12-24 DIAGNOSIS — N979 Female infertility, unspecified: Secondary | ICD-10-CM

## 2023-12-24 NOTE — Telephone Encounter (Signed)
 Done

## 2024-01-29 ENCOUNTER — Other Ambulatory Visit (INDEPENDENT_AMBULATORY_CARE_PROVIDER_SITE_OTHER)

## 2024-01-29 ENCOUNTER — Encounter: Payer: Self-pay | Admitting: Nurse Practitioner

## 2024-01-29 ENCOUNTER — Ambulatory Visit (INDEPENDENT_AMBULATORY_CARE_PROVIDER_SITE_OTHER): Admitting: Nurse Practitioner

## 2024-01-29 VITALS — BP 118/78 | HR 63 | Wt 220.0 lb

## 2024-01-29 DIAGNOSIS — Z6836 Body mass index (BMI) 36.0-36.9, adult: Secondary | ICD-10-CM

## 2024-01-29 DIAGNOSIS — Z833 Family history of diabetes mellitus: Secondary | ICD-10-CM

## 2024-01-29 DIAGNOSIS — N979 Female infertility, unspecified: Secondary | ICD-10-CM | POA: Diagnosis not present

## 2024-01-29 DIAGNOSIS — E282 Polycystic ovarian syndrome: Secondary | ICD-10-CM | POA: Diagnosis not present

## 2024-01-29 NOTE — Progress Notes (Signed)
   Acute Office Visit  Subjective:    Patient ID: Kayla Perez, female    DOB: 23-May-2001, 23 y.o.   MRN: [id number]   HPI 23 y.o. G0 presents today for ultrasound. Seen as new patient 11/05/2023 for fertility consult. Has been trying to conceive for 18 months. Monthly cycles. Has been tracking cycles/ovulation with app. Does not notice ovulation symptoms. 21-day progesterone  6.4 in June. Normal TSH (was mildly low previously 11/25/23). Recommendations to start PNV, continue tracking cycles, encouraged healthy living, decrease nicotine and marijuana use.  Occasional alcohol use, regular marijuana use. Same partner for 4 years. He has conceived once, he is 22 yo, does smoke cigarettes. Mother with diabetes. No known family history of PCOS.   Patient's last menstrual period was 01/25/2024 (exact date). Period Duration (Days): 4-5 Period Pattern: Regular Menstrual Flow: Moderate Menstrual Control: Tampon Dysmenorrhea: (!) Moderate Dysmenorrhea Symptoms: Nausea  Review of Systems  Constitutional: Negative.   Genitourinary: Negative.        Objective:    Physical Exam Constitutional:      Appearance: Normal appearance. She is obese.     BP 118/78 (BP Location: Right Arm, Patient Position: Sitting, Cuff Size: Normal)   Pulse 63   Wt 220 lb (99.8 kg)   LMP 01/25/2024 (Exact Date)   SpO2 98%   BMI 36.61 kg/m  Wt Readings from Last 3 Encounters:  01/29/24 220 lb (99.8 kg)  11/05/23 211 lb (95.7 kg)  01/31/23 190 lb (86.2 kg)        Assessment & Plan:   Problem List Items Addressed This Visit   None Visit Diagnoses       Primary female infertility    -  Primary   Relevant Orders   Testos,Total,Free and SHBG (Female)     BMI 36.0-36.9,adult       Relevant Orders   Hemoglobin A1c     Family history of diabetes mellitus in mother       Relevant Orders   Hemoglobin A1c     PCOS (polycystic ovarian syndrome)          Vaginal ultrasound: Anteverted uterus, normal  size and shape, no myometrial masses.  Thin, symmetrical endometrium.  No masses or thickening seen, avascular.  Both ovaries have a PCOS appearance with normal perfusion.  No adnexal masses, free fluid in CDS.  Plan: Reviewed ultrasound and PCOS appearance of ovaries. Educated on PCOS S/S, diagnosis, and management options. Will start inositol. Will check A1c, testosterone panel today. Will start Metformin if A1c elevated. Discussed Letrozole.   Follow up to be determined.     Kayla DELENA Shutter DNP, 3:06 PM 01/29/2024

## 2024-01-30 ENCOUNTER — Ambulatory Visit: Payer: Self-pay | Admitting: Nurse Practitioner

## 2024-02-03 LAB — HEMOGLOBIN A1C
Hgb A1c MFr Bld: 5.5 % (ref <5.7)
Mean Plasma Glucose: 111 mg/dL
eAG (mmol/L): 6.2 mmol/L

## 2024-02-03 LAB — TESTOS,TOTAL,FREE AND SHBG (FEMALE)
Free Testosterone: 2.7 pg/mL (ref 0.1–6.4)
Sex Hormone Binding: 49 nmol/L (ref 17–124)
Testosterone, Total, LC-MS-MS: 32 ng/dL (ref 2–45)

## 2024-02-04 ENCOUNTER — Other Ambulatory Visit: Payer: Self-pay | Admitting: Nurse Practitioner

## 2024-02-04 DIAGNOSIS — N979 Female infertility, unspecified: Secondary | ICD-10-CM

## 2024-02-04 MED ORDER — CLOMIPHENE CITRATE 50 MG PO TABS: 50.0000 mg | ORAL_TABLET | Freq: Every day | ORAL | 0 refills | Status: DC

## 2024-02-06 ENCOUNTER — Other Ambulatory Visit: Payer: Self-pay

## 2024-02-06 DIAGNOSIS — N979 Female infertility, unspecified: Secondary | ICD-10-CM

## 2024-02-06 NOTE — Telephone Encounter (Signed)
 Med refill request: Clomid   Last AEX: 01/29/24 Next AEX: not scheduled  Last MMG (if hormonal med) n/a Refill authorized: Pharmacy sent over drug change request for clomid . Medication is not covered under her insurance. Pharmacy is requesting generic be sent in instead. - Clomiphene  tablets or Alternate prescription. Please advise

## 2024-02-10 ENCOUNTER — Other Ambulatory Visit: Payer: Self-pay | Admitting: Nurse Practitioner

## 2024-02-10 NOTE — Telephone Encounter (Signed)
 No alternative available. Please let patient know this is not covered by her insurance. Looks like she can use [facility] and get for around $25 at [employer].

## 2024-02-12 ENCOUNTER — Telehealth: Payer: Self-pay

## 2024-02-12 NOTE — Telephone Encounter (Signed)
 Patient was sent a MyChart message giving the information about no alternative medications and to try a coupon on [facility].

## 2024-04-08 MED ORDER — CLOMIPHENE CITRATE 50 MG PO TABS: 50.0000 mg | ORAL_TABLET | Freq: Every day | ORAL | 0 refills | Status: AC

## 2024-04-08 NOTE — Telephone Encounter (Signed)
 Opened in error

## 2024-04-22 ENCOUNTER — Encounter: Payer: Self-pay | Admitting: Nurse Practitioner

## 2024-06-22 ENCOUNTER — Encounter: Payer: Self-pay | Admitting: Nurse Practitioner

## 2024-06-22 ENCOUNTER — Other Ambulatory Visit: Payer: Self-pay | Admitting: Nurse Practitioner

## 2024-06-22 DIAGNOSIS — N979 Female infertility, unspecified: Secondary | ICD-10-CM

## 2024-07-05 ENCOUNTER — Other Ambulatory Visit

## 2024-07-05 DIAGNOSIS — N979 Female infertility, unspecified: Secondary | ICD-10-CM

## 2024-07-06 ENCOUNTER — Ambulatory Visit: Payer: Self-pay | Admitting: Nurse Practitioner

## 2024-07-06 DIAGNOSIS — N979 Female infertility, unspecified: Secondary | ICD-10-CM

## 2024-07-06 LAB — PROGESTERONE: Progesterone: 22 ng/mL

## 2024-07-14 ENCOUNTER — Other Ambulatory Visit: Payer: Self-pay | Admitting: Radiology

## 2024-07-14 MED ORDER — CLOMIPHENE CITRATE 50 MG PO TABS: ORAL_TABLET | ORAL | 0 refills | Status: AC

## 2024-07-14 NOTE — Telephone Encounter (Signed)
 Yes, those are correct.

## 2024-07-14 NOTE — Addendum Note (Signed)
 Addended by: VICCI ZADA PARAS on: 07/14/2024 02:46 PM   Modules accepted: Orders

## 2024-07-14 NOTE — Addendum Note (Signed)
 Addended by: VICCI ZADA PARAS on: 07/14/2024 02:38 PM   Modules accepted: Orders

## 2024-07-14 NOTE — Telephone Encounter (Signed)
 Can repeat another round of clomid  and day 21 progesterone  (please order under TW so results go to her).

## 2024-07-14 NOTE — Telephone Encounter (Signed)
 Shasta can you check the orders for progesterone  & clomid  to verify they are correct before I sign them for Tiffany.
